# Patient Record
Sex: Male | Born: 1960 | Race: White | Hispanic: No | Marital: Single
Health system: Southern US, Community
[De-identification: ages and names within clinical notes are randomized; demographics above are authoritative.]

## PROBLEM LIST (undated history)

## (undated) DIAGNOSIS — E079 Disorder of thyroid, unspecified: Secondary | ICD-10-CM

## (undated) DIAGNOSIS — C801 Malignant (primary) neoplasm, unspecified: Secondary | ICD-10-CM

## (undated) HISTORY — PX: COLECTOMY: SHX59

---

## 2000-04-06 ENCOUNTER — Emergency Department (HOSPITAL_COMMUNITY): Admission: EM | Admit: 2000-04-06 | Discharge: 2000-04-06 | Payer: Self-pay | Admitting: *Deleted

## 2000-11-08 ENCOUNTER — Emergency Department (HOSPITAL_COMMUNITY): Admission: EM | Admit: 2000-11-08 | Discharge: 2000-11-08 | Payer: Self-pay | Admitting: *Deleted

## 2001-05-15 ENCOUNTER — Emergency Department (HOSPITAL_COMMUNITY): Admission: EM | Admit: 2001-05-15 | Discharge: 2001-05-15 | Payer: Self-pay

## 2001-05-17 ENCOUNTER — Emergency Department (HOSPITAL_COMMUNITY): Admission: EM | Admit: 2001-05-17 | Discharge: 2001-05-17 | Payer: Self-pay | Admitting: Emergency Medicine

## 2001-09-28 ENCOUNTER — Encounter: Payer: Self-pay | Admitting: Emergency Medicine

## 2001-09-28 ENCOUNTER — Emergency Department (HOSPITAL_COMMUNITY): Admission: EM | Admit: 2001-09-28 | Discharge: 2001-09-28 | Payer: Self-pay | Admitting: Emergency Medicine

## 2005-05-30 ENCOUNTER — Emergency Department (HOSPITAL_COMMUNITY): Admission: EM | Admit: 2005-05-30 | Discharge: 2005-05-30 | Payer: Self-pay | Admitting: Emergency Medicine

## 2009-06-01 ENCOUNTER — Ambulatory Visit: Payer: Self-pay | Admitting: Family Medicine

## 2009-06-01 IMAGING — CT CT ABD-PELV W/ CM
2 of 3 series · 15 of 42 positions shown, 20 images · non-contrast
Comparison: none

REASON FOR EXAM: rlq pain x 3 days
COMMENTS:

[Series 2: appendicitis · axial · 0.64mm/px · z∈[-506,-146]mm · 12 of 139 slices shown, 16 images]
[im 13/139  soft-tissue]
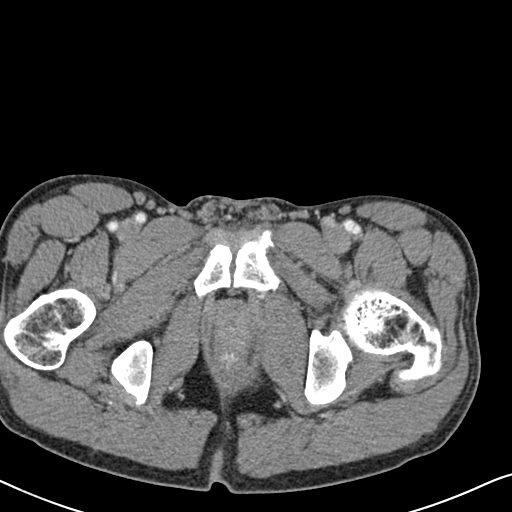
[im 13/139  bone]
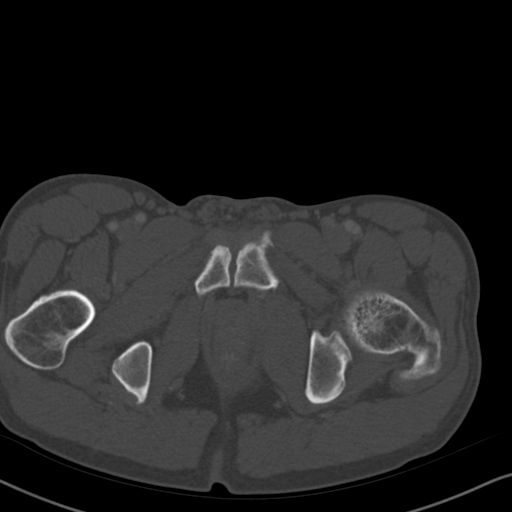
[im 25/139  soft-tissue]
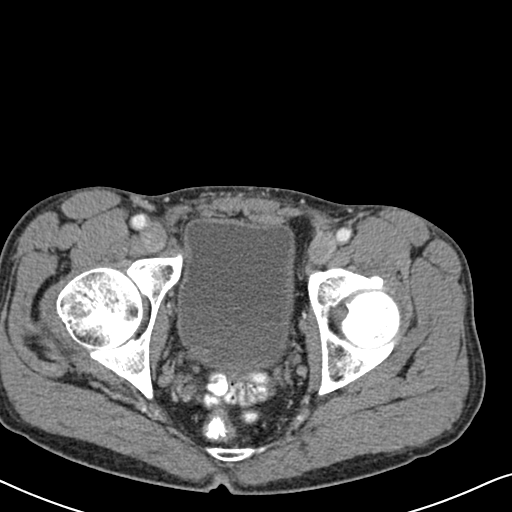
[im 37/139  soft-tissue]
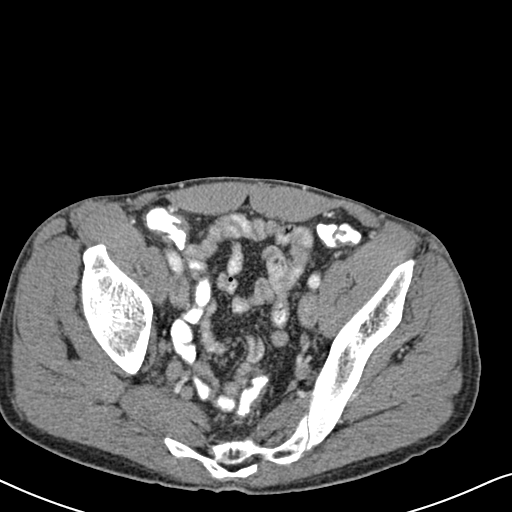
[im 49/139  soft-tissue]
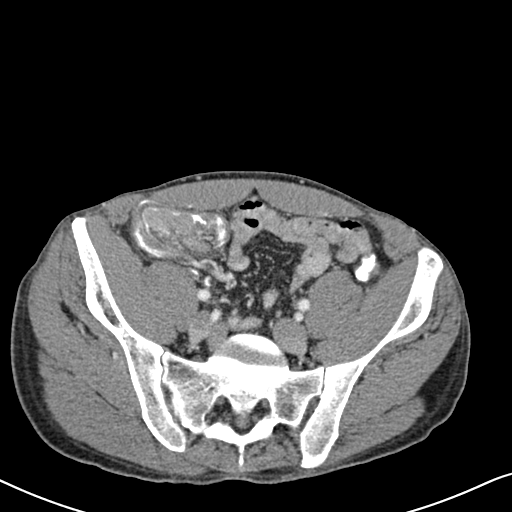
[im 61/139  soft-tissue]
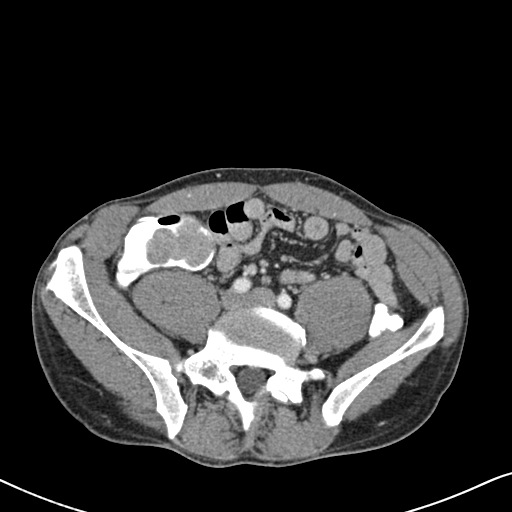
[im 79/139  soft-tissue]
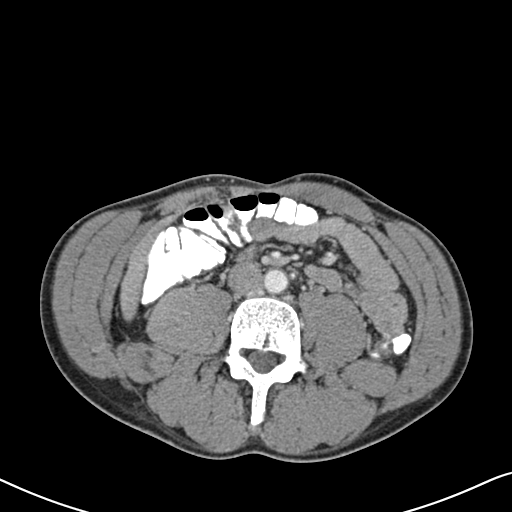
[im 91/139  soft-tissue]
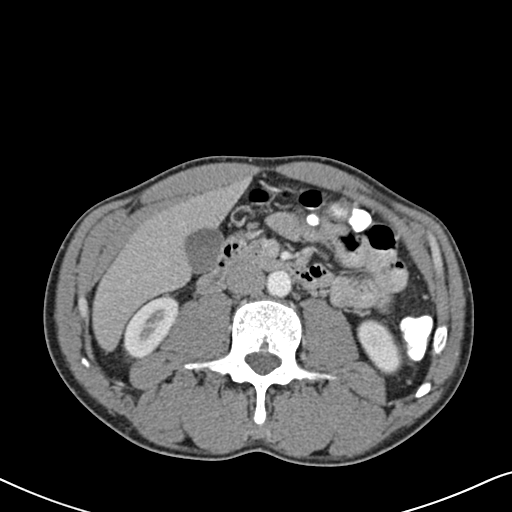
[im 103/139  soft-tissue]
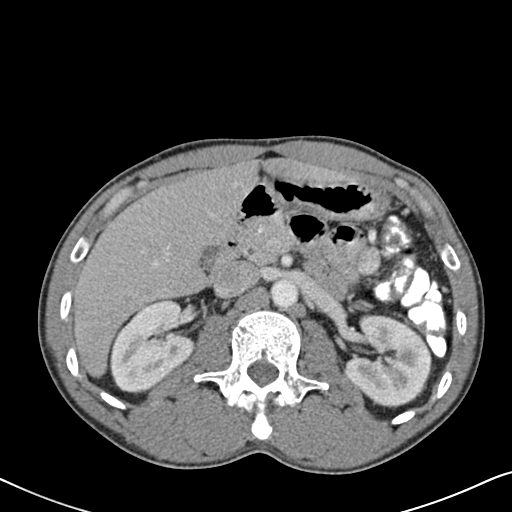
[im 115/139  soft-tissue]
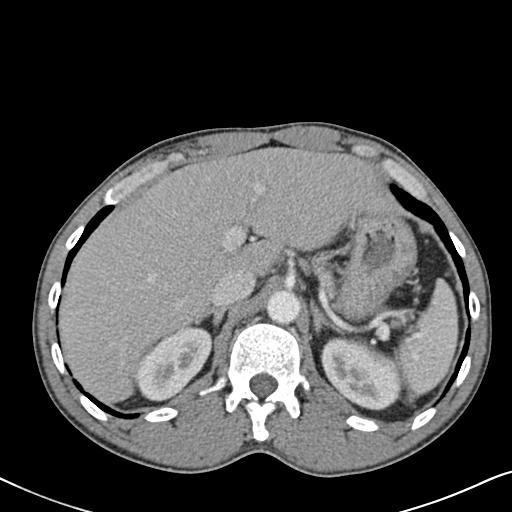
[im 115/139  lung]
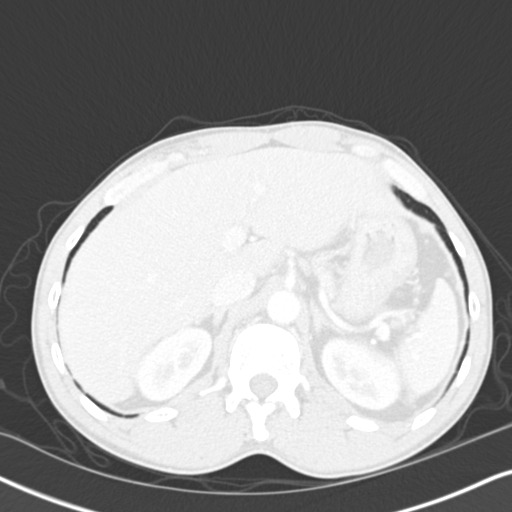
[im 115/139  bone]
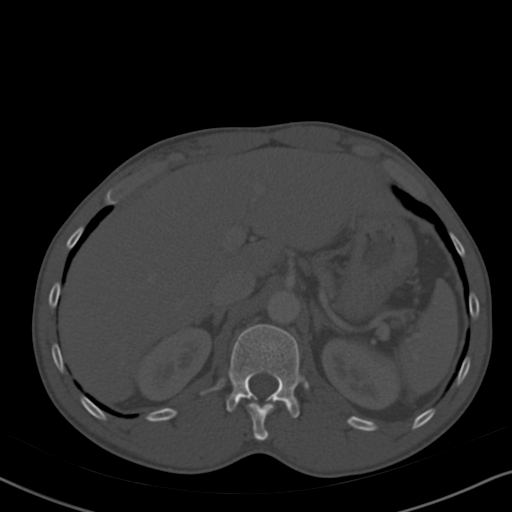
[im 121/139  lung]
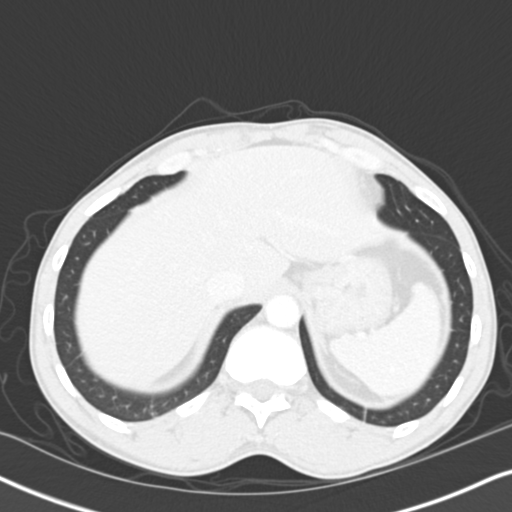
[im 127/139  soft-tissue]
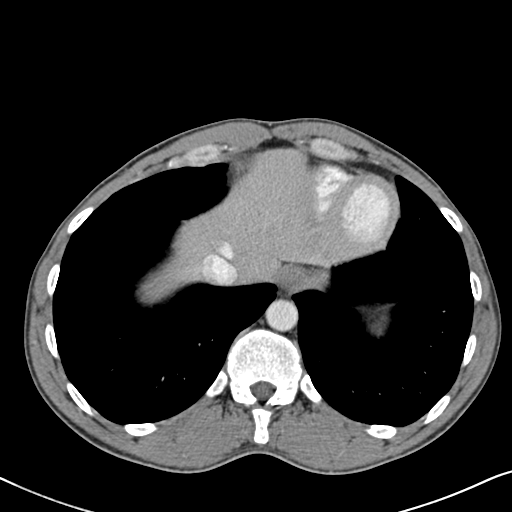
[im 127/139  lung]
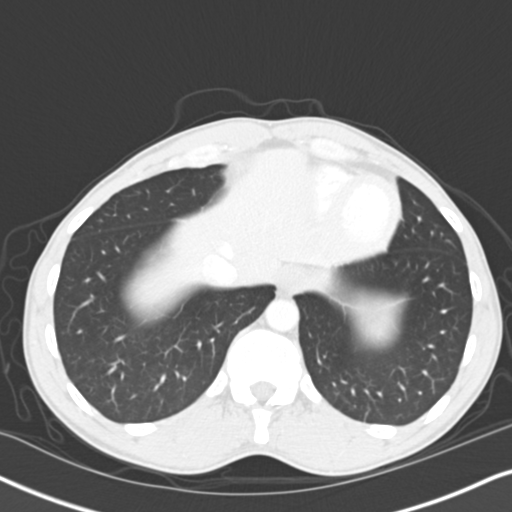
[im 133/139  lung]
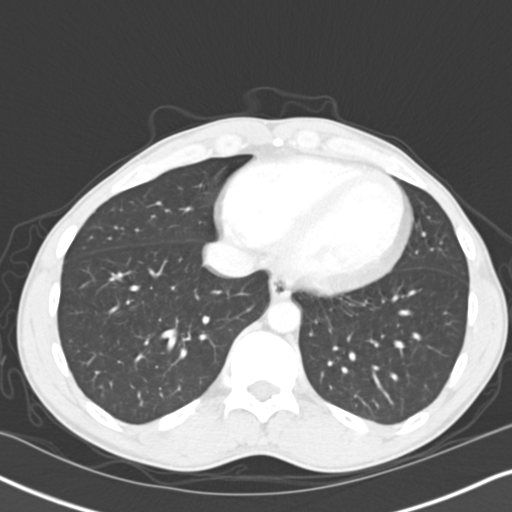

[Series 602: coronals · coronal · 0.83mm/px · 3 of 73 slices shown, 4 images]
[im 25/73  soft-tissue]
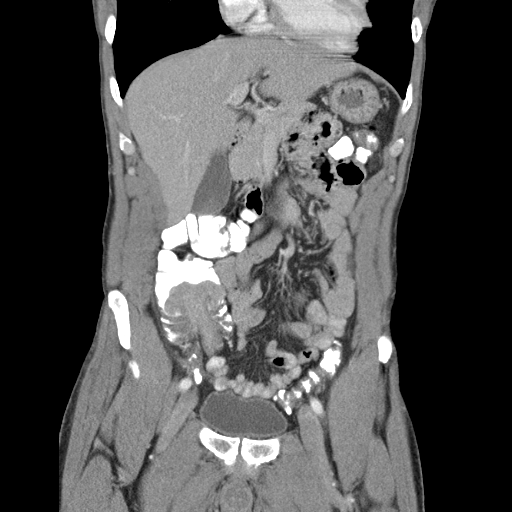
[im 33/73  soft-tissue]
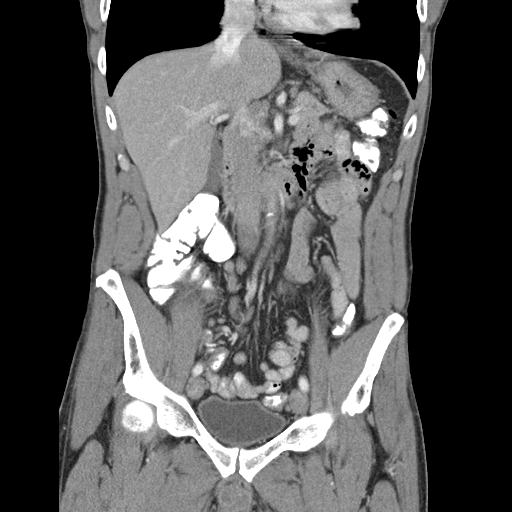
[im 33/73  bone]
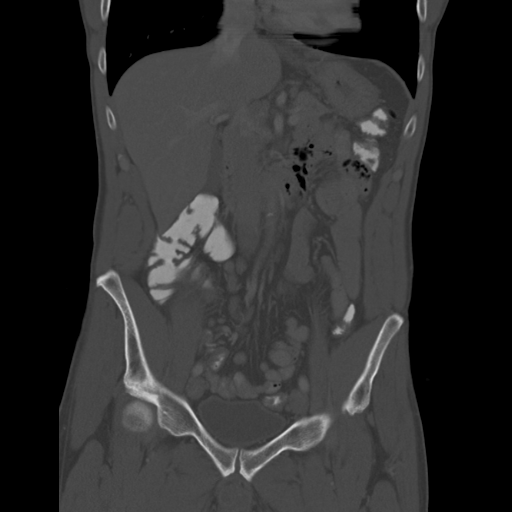
[im 41/73  soft-tissue]
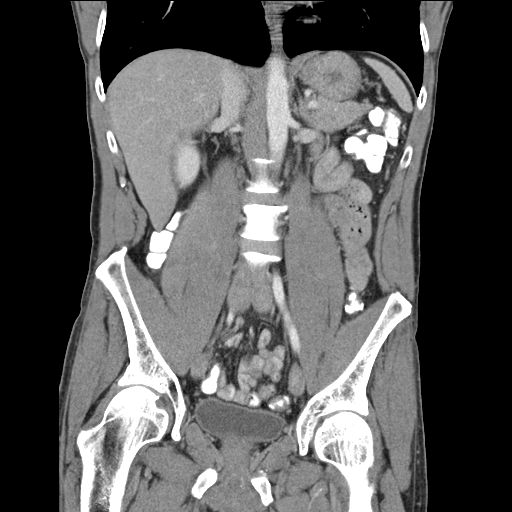

[15 of 42 positions shown; findings below may reference images not displayed]

PROCEDURE:     CT  - CT ABDOMEN / PELVIS  W  - [DATE]  [DATE]

RESULT:     History: Right lower quadrant pain.

Procedure and findings: IV contrast enhanced CT the abdomen/ pelvis obtained
following administration 100 cc of [3X]. Liver normal. Spleen normal.
Pancreas normal. Adrenals normal. Kidneys are normal. No bowel distention.
There is a soft tissue mass within the cecum and soft tissue swelling of the
terminal ileum. Malignancy could present this fashion. Colitis could present
this fashion. No bowel obstruction. No free air. No other focal abnormality
identified.
IMPRESSION: Cecal mass with involvement of the terminal ileum worrisome
for malignancy. Colonoscopy suggested for further evaluation. Report phoned
to the patient's caregiver.

## 2010-10-18 ENCOUNTER — Emergency Department: Payer: Self-pay | Admitting: Emergency Medicine

## 2012-06-13 ENCOUNTER — Emergency Department: Payer: Self-pay | Admitting: Emergency Medicine

## 2013-02-25 ENCOUNTER — Emergency Department: Payer: Self-pay | Admitting: Emergency Medicine

## 2013-02-25 LAB — COMPREHENSIVE METABOLIC PANEL
Albumin: 3.7 g/dL (ref 3.4–5.0)
Alkaline Phosphatase: 139 U/L — ABNORMAL HIGH (ref 50–136)
Anion Gap: 6 — ABNORMAL LOW (ref 7–16)
Calcium, Total: 9.9 mg/dL (ref 8.5–10.1)
Chloride: 105 mmol/L (ref 98–107)
Creatinine: 0.89 mg/dL (ref 0.60–1.30)
EGFR (African American): >60
EGFR (Non-African Amer.): >60
Osmolality: 275 (ref 275–301)
Potassium: 3.9 mmol/L (ref 3.5–5.1)
SGPT (ALT): 73 U/L (ref 12–78)
Sodium: 138 mmol/L (ref 136–145)

## 2013-02-25 LAB — CBC
HCT: 47.1 % (ref 40.0–52.0)
HGB: 16.3 g/dL (ref 13.0–18.0)
MCH: 29.6 pg (ref 26.0–34.0)
MCHC: 34.5 g/dL (ref 32.0–36.0)
MCV: 86 fL (ref 80–100)
Platelet: 151 10*3/uL (ref 150–440)
RBC: 5.5 10*6/uL (ref 4.40–5.90)
RDW: 13.9 % (ref 11.5–14.5)
WBC: 5.6 10*3/uL (ref 3.8–10.6)

## 2015-07-29 ENCOUNTER — Ambulatory Visit
Admission: EM | Admit: 2015-07-29 | Discharge: 2015-07-29 | Disposition: A | Discharge status: Home / Self Care | Payer: BLUE CROSS/BLUE SHIELD | Attending: Family Medicine | Admitting: Family Medicine

## 2015-07-29 DIAGNOSIS — I8001 Phlebitis and thrombophlebitis of superficial vessels of right lower extremity: Secondary | ICD-10-CM | POA: Diagnosis not present

## 2015-07-29 HISTORY — DX: Disorder of thyroid, unspecified: E07.9

## 2015-07-29 HISTORY — DX: Malignant (primary) neoplasm, unspecified: C80.1

## 2015-07-29 NOTE — ED Notes (Signed)
Has had 'bumps' right posterior lower leg "for months". Now getting sore when touched and states right ankle area "darker" in color, but not painful

## 2015-07-29 NOTE — ED Provider Notes (Signed)
CSN: [id number]     Arrival date & time 07/29/15  1311 History   First MD Initiated Contact with Patient 07/29/15 1511     Chief Complaint  Patient presents with  . Rash   (Consider location/radiation/quality/duration/timing/severity/associated sxs/prior Treatment) HPI Comments: 54 yo male with a 2-3 weeks h/o right lower leg skin "bumps", pain and skin color changes ("darker spots"). Denies any trauma, injuries, sores, ulcers, fevers, chills, drainage, prolonged immobilization, insect bites. States is on his feet a lot, standing most of the day. Denies chest pains or shortness of breath.  Patient is a 54 y.o. male presenting with rash. The history is provided by the patient.  Rash   Past Medical History  Diagnosis Date  . Thyroid disease   . Cancer [facility])    Past Surgical History  Procedure Laterality Date  . Colectomy     History reviewed. No pertinent family history. Social History  Substance Use Topics  . Smoking status: Current Every Day Smoker -- 1.00 packs/day    Types: Cigarettes  . Smokeless tobacco: None  . Alcohol Use: Yes     Comment: socially    Review of Systems  Skin: Positive for rash.    Allergies  Review of patient's allergies indicates no known allergies.  Home Medications   Prior to Admission medications   Not on File   Meds Ordered and Administered this Visit  Medications - No data to display  BP 139/75 mmHg  Pulse 62  Temp(Src) 97.1 F (36.2 C) (Tympanic)  Resp 16  Ht 6' (1.829 m)  Wt 155 lb (70.308 kg)  BMI 21.02 kg/m2  SpO2 99% No data found.   Physical Exam  Constitutional: He appears well-developed and well-nourished. No distress.  Cardiovascular: Intact distal pulses.   Skin: He is not diaphoretic.  Right posterior lower leg with multiple medium-large superficial skin varicosities with mild tenderness to palpation and "ropey" consistency to touch  Nursing note and vitals reviewed.   ED Course  Procedures (including  critical care time)  Labs Review Labs Reviewed - No data to display  Imaging Review No results found.   Visual Acuity Review  Right Eye Distance:   Left Eye Distance:   Bilateral Distance:    Right Eye Near:   Left Eye Near:    Bilateral Near:         MDM   1. Thrombophlebitis leg superficial, right     There are no discharge medications for this patient.  1. diagnosis reviewed with patient 2.  Recommend supportive treatment with warm compresses to area and aspirin tid; elevate leg;  3. Follow-up prn if symptoms worsen or don't improve  Norval Gable, MD 07/29/15 [phone]

## 2015-07-29 NOTE — Discharge Instructions (Signed)
Phlebitis  Phlebitis is soreness and swelling (inflammation) of a vein. This can occur in your arms, legs, or torso (trunk), as well as deeper inside your body. Phlebitis is usually not serious when it occurs close to the surface of the body. However, it can cause serious problems when it occurs in a vein deeper inside the body.  CAUSES   Phlebitis can be triggered by various things, including:    Reduced blood flow through your veins. This can happen with:    Bed rest over a long period.    Long-distance travel.    Injury.    Surgery.    Being overweight (obese) or pregnant.   Having an IV tube put in the vein and getting certain medicines through the vein.   Cancer and cancer treatment.   Use of illegal drugs taken through the vein.   Inflammatory diseases.   Inherited (genetic) diseases that increase the risk of blood clots.   Hormone therapy, such as birth control pills.  SIGNS AND SYMPTOMS    Red, tender, swollen, and painful area on your skin. Usually, the area will be long and narrow.   Firmness along the center of the affected area. This can indicate that a blood clot has formed.   Low-grade fever.  DIAGNOSIS   A health care provider can usually diagnose phlebitis by examining the affected area and asking about your symptoms. To check for infection or blood clots, your health care provider may order blood tests or an ultrasound exam of the area. Blood tests and your family history may also indicate if you have an underlying genetic disease that causes blood clots. Occasionally, a piece of tissue is taken from the body (biopsy sample) if an unusual cause of phlebitis is suspected.  TREATMENT   Treatment will vary depending on the severity of the condition and the area of the body affected. Treatment may include:   Use of a warm compress or heating pad.   Use of compression stockings or bandages.   Anti-inflammatory medicines.   Removal of any IV tube that may be causing the problem.   Medicines  that kill germs (antibiotics) if an infection is present.   Blood-thinning medicines if a blood clot is suspected or present.   In rare cases, surgery may be needed to remove damaged sections of vein.  HOME CARE INSTRUCTIONS    Only take over-the-counter or prescription medicines as directed by your health care provider. Take all medicines exactly as prescribed.   Raise (elevate) the affected area above the level of your heart as directed by your health care provider.   Apply a warm compress or heating pad to the affected area as directed by your health care provider. Do not sleep with the heating pad.   Use compression stockings or bandages as directed. These will speed healing and prevent the condition from coming back.   If you are on blood thinners:    Get follow-up blood tests as directed by your health care provider.    Check with your health care provider before using any new medicines.    Carry a medical alert card or wear your medical alert jewelry to show that you are on blood thinners.   For phlebitis in the legs:    Avoid prolonged standing or bed rest.    Keep your legs moving. Raise your legs when sitting or lying.   Do not smoke.   Women, particularly those over the age of 35, should consider   the risks and benefits of taking the contraceptive pill. This kind of hormone treatment can increase your risk for blood clots.   Follow up with your health care provider as directed.  SEEK MEDICAL CARE IF:    You have unusual bruising or any bleeding problems.   Your swelling or pain in the affected area is not improving.   You are on anti-inflammatory medicine, and you develop belly (abdominal) pain.  SEEK IMMEDIATE MEDICAL CARE IF:    You have a sudden onset of chest pain or difficulty breathing.   You have a fever or persistent symptoms for more than 2-3 days.   You have a fever and your symptoms suddenly get worse.  MAKE SURE YOU:   Understand these instructions.   Will watch your  condition.   Will get help right away if you are not doing well or get worse.     This information is not intended to replace advice given to you by your health care provider. Make sure you discuss any questions you have with your health care provider.     Document Released: 08/11/2001 Document Revised: 06/07/2013 Document Reviewed: 04/24/2013  Elsevier Interactive Patient Education 2016 [employer]

## 2016-12-21 ENCOUNTER — Other Ambulatory Visit: Payer: Self-pay | Admitting: Internal Medicine

## 2016-12-22 ENCOUNTER — Other Ambulatory Visit: Payer: Self-pay | Admitting: Internal Medicine

## 2016-12-22 DIAGNOSIS — E0500 Thyrotoxicosis with diffuse goiter without thyrotoxic crisis or storm: Secondary | ICD-10-CM

## 2017-01-21 ENCOUNTER — Encounter
Admission: RE | Admit: 2017-01-21 | Discharge: 2017-01-21 | Disposition: A | Discharge status: Home / Self Care | Payer: BLUE CROSS/BLUE SHIELD | Source: Ambulatory Visit | Attending: Internal Medicine | Admitting: Internal Medicine

## 2017-01-21 ENCOUNTER — Ambulatory Visit
Admission: RE | Admit: 2017-01-21 | Discharge: 2017-01-21 | Disposition: A | Discharge status: Home / Self Care | Payer: BLUE CROSS/BLUE SHIELD | Source: Ambulatory Visit | Attending: Internal Medicine | Admitting: Internal Medicine

## 2017-01-21 DIAGNOSIS — E0500 Thyrotoxicosis with diffuse goiter without thyrotoxic crisis or storm: Secondary | ICD-10-CM | POA: Insufficient documentation

## 2017-01-21 MED ORDER — SODIUM IODIDE I-123 7.4 MBQ CAPS
155.1000 | ORAL_CAPSULE | Freq: Once | ORAL | Status: AC
  Administered 2017-01-21: 155.1 via ORAL

## 2017-01-22 ENCOUNTER — Encounter
Admission: RE | Admit: 2017-01-22 | Discharge: 2017-01-22 | Disposition: A | Discharge status: Home / Self Care | Payer: BLUE CROSS/BLUE SHIELD | Source: Ambulatory Visit | Attending: Internal Medicine | Admitting: Internal Medicine

## 2017-01-26 ENCOUNTER — Ambulatory Visit: Payer: BLUE CROSS/BLUE SHIELD

## 2017-01-28 ENCOUNTER — Encounter
Admission: RE | Admit: 2017-01-28 | Discharge: 2017-01-28 | Disposition: A | Discharge status: Home / Self Care | Payer: BLUE CROSS/BLUE SHIELD | Source: Ambulatory Visit | Attending: Internal Medicine | Admitting: Internal Medicine

## 2017-01-28 DIAGNOSIS — E0500 Thyrotoxicosis with diffuse goiter without thyrotoxic crisis or storm: Secondary | ICD-10-CM | POA: Insufficient documentation

## 2017-01-28 MED ORDER — SODIUM IODIDE I 131 CAPSULE
15.1000 | Freq: Once | INTRAVENOUS | Status: AC | PRN
  Administered 2017-01-28: 15.1 via ORAL

## 2017-04-05 ENCOUNTER — Encounter (INDEPENDENT_AMBULATORY_CARE_PROVIDER_SITE_OTHER): Payer: BLUE CROSS/BLUE SHIELD | Admitting: Vascular Surgery

## 2020-09-06 ENCOUNTER — Other Ambulatory Visit: Payer: Self-pay

## 2020-09-06 DIAGNOSIS — Z20822 Contact with and (suspected) exposure to covid-19: Secondary | ICD-10-CM

## 2020-09-10 LAB — NOVEL CORONAVIRUS, NAA: SARS-CoV-2, NAA: DETECTED — AB

## 2021-04-13 ENCOUNTER — Other Ambulatory Visit: Payer: Self-pay

## 2021-04-13 ENCOUNTER — Encounter (HOSPITAL_COMMUNITY): Payer: Self-pay | Admitting: Emergency Medicine

## 2021-04-13 ENCOUNTER — Ambulatory Visit (HOSPITAL_COMMUNITY)
Admission: EM | Admit: 2021-04-13 | Discharge: 2021-04-13 | Disposition: A | Discharge status: Home / Self Care | Payer: PRIVATE HEALTH INSURANCE | Attending: Student | Admitting: Student

## 2021-04-13 DIAGNOSIS — E0500 Thyrotoxicosis with diffuse goiter without thyrotoxic crisis or storm: Secondary | ICD-10-CM | POA: Diagnosis present

## 2021-04-13 DIAGNOSIS — I83811 Varicose veins of right lower extremities with pain: Secondary | ICD-10-CM | POA: Insufficient documentation

## 2021-04-13 DIAGNOSIS — Z0189 Encounter for other specified special examinations: Secondary | ICD-10-CM | POA: Diagnosis present

## 2021-04-13 LAB — TSH: TSH: <0.01 u[IU]/mL — ABNORMAL LOW (ref 0.350–4.500)

## 2021-04-13 NOTE — ED Provider Notes (Signed)
[place]    CSN: [id number] Arrival date & time: 04/13/21  1117      History   Chief Complaint Chief Complaint  Patient presents with   Ankle Pain    HPI Alexander Raymond is a 60 y.o. male presenting with multiple complaints.  This patient is new to the area and has not received medical care in about 8 months.  Medical history hyperthyroid, colon cancer/colectomy. -Endorses 5-year history of right lower leg and ankle swelling and intermittent pain.  Denies changes in this, new trauma or overuse.  Does state that he stands all day at work, he wears compression socks due to this.  Standing all day does seem to make the pain worse. -Has not been on thyroid medication in 7 or 8 months, endorses jitteriness, weight loss.  He does have an endocrinologist, but has not followed with them in about 2 years due to losing insurance.  He does have insurance again now.  Requesting help establishing care [place].  Denies chest pain, shortness of breath, sensation of heart racing. -He also notes about 8 months of hoarse voice, and states that it feels like food is taking longer to pass through his throat.  Denies trouble swallowing, shortness of breath.  States that his endocrinologist actually did an ultrasound of the thyroid that was normal 2 years ago.  HPI  Past Medical History:  Diagnosis Date   Cancer [facility])    Thyroid disease     There are no problems to display for this patient.   Past Surgical History:  Procedure Laterality Date   COLECTOMY         Home Medications    Prior to Admission medications   Medication Sig Start Date End Date Taking? Authorizing Provider  methimazole (TAPAZOLE) 10 MG tablet Take 1 tablet by mouth daily. Patient not taking: Reported on 04/13/2021 04/05/19   [provider]    Family History Family History  Problem Relation Age of Onset   Hypertension Mother    Healthy Father     Social History Social History   Tobacco  Use   Smoking status: Every Day    Packs/day: 1.00    Types: Cigarettes   Smokeless tobacco: Never  Vaping Use   Vaping Use: Never used  Substance Use Topics   Alcohol use: Yes    Comment: socially   Drug use: Never     Allergies   Patient has no known allergies.   Review of Systems Review of Systems  HENT:  Positive for voice change.   Musculoskeletal:        R ankle pain  All other systems reviewed and are negative.   Physical Exam Triage Vital Signs ED Triage Vitals  Enc Vitals Group     BP 04/13/21 1235 140/86     Pulse Rate 04/13/21 1235 (!) 54     Resp 04/13/21 1235 18     Temp 04/13/21 1235 98.4 F (36.9 C)     Temp Source 04/13/21 1235 Oral     SpO2 04/13/21 1235 100 %     Weight --      Height --      Head Circumference --      Peak Flow --      Pain Score 04/13/21 1232 5     Pain Loc --      Pain Edu? --      Excl. in [place]? --    No data found.  Updated Vital  Signs BP 140/86 (BP Location: Left Arm)   Pulse (!) 54   Temp 98.4 F (36.9 C) (Oral)   Resp 18   SpO2 100%   Visual Acuity Right Eye Distance:   Left Eye Distance:   Bilateral Distance:    Right Eye Near:   Left Eye Near:    Bilateral Near:     Physical Exam Vitals reviewed.  Constitutional:      General: He is not in acute distress.    Appearance: Normal appearance. He is not ill-appearing or diaphoretic.  HENT:     Head: Normocephalic and atraumatic.  Neck:     Thyroid: No thyroid mass, thyromegaly or thyroid tenderness.     Comments: Hoarse voice  Cardiovascular:     Rate and Rhythm: Normal rate and regular rhythm.     Heart sounds: Normal heart sounds.  Pulmonary:     Effort: Pulmonary effort is normal.     Breath sounds: Normal breath sounds.  Musculoskeletal:     Cervical back: Full passive range of motion without pain.     Comments: R ankle - varicose veins. No tenderness of medial or lateral malleolus. No effusion. ROM plantar and dorsiflexion intact and without  pain. DP 2+, cap refil <2 seconds.   Lymphadenopathy:     Cervical: No cervical adenopathy.     Right cervical: No superficial cervical adenopathy.    Left cervical: No superficial cervical adenopathy.  Skin:    General: Skin is warm.  Neurological:     General: No focal deficit present.     Mental Status: He is alert and oriented to person, place, and time.  Psychiatric:        Mood and Affect: Mood normal.        Behavior: Behavior normal.        Thought Content: Thought content normal.        Judgment: Judgment normal.     UC Treatments / Results  Labs (all labs ordered are listed, but only abnormal results are displayed) Labs Reviewed  TSH    EKG   Radiology No results found.  Procedures Procedures (including critical care time)  Medications Ordered in UC Medications - No data to display  Initial Impression / Assessment and Plan / UC Course  I have reviewed the triage vital signs and the nursing notes.  Pertinent labs & imaging results that were available during my care of the patient were reviewed by me and considered in my medical decision making (see chart for details).     This patient is a very pleasant 60 y.o. year old male presenting with hyperthyroid/graves disease, varicose veins.  Has been off of his thyroid medication for about 8 months due to losing his insurance, he now has insurance again and is wishing to restart this.  He is followed by endocrinology, I advised him to call them tomorrow.  We will go ahead and check a TSH today.  For varicose veins, continue compression.  I sent a PCP referral, and establish care with them. ED return precautions discussed. Patient verbalizes understanding and agreement.    Final Clinical Impressions(s) / UC Diagnoses   Final diagnoses:  Graves disease  Routine lab draw  Varicose veins of right lower extremity with pain     Discharge Instructions      -Please call your endocrinologist tomorrow to follow-up  with them, they will need to restart your medication. -I placed a referral for you to establish care with a  PCP, please establish care with them -Compression socks at work and while standing.     ED Prescriptions   None    PDMP not reviewed this encounter.   Hazel Sams, PA-C 04/13/21 1411

## 2021-04-13 NOTE — ED Triage Notes (Signed)
Patient reports a 5 year history of right lower leg/ankle swelling and intermittent pain for 5 (five) years  Patient reports he has not been on thyroid medicine for 7-8 months: reports jitteriness, weight loss.    Patient asking for assistance in establishing care in [place].

## 2021-04-13 NOTE — Discharge Instructions (Addendum)
-  Please call your endocrinologist tomorrow to follow-up with them, they will need to restart your medication. -I placed a referral for you to establish care with a PCP, please establish care with them -Compression socks at work and while standing.

## 2021-04-14 ENCOUNTER — Telehealth (HOSPITAL_COMMUNITY): Payer: Self-pay | Admitting: Emergency Medicine

## 2021-04-14 MED ORDER — METHIMAZOLE 10 MG PO TABS: 10.0000 mg | ORAL_TABLET | Freq: Every day | ORAL | 0 refills | Status: AC

## 2023-06-23 ENCOUNTER — Institutional Professional Consult (permissible substitution) (INDEPENDENT_AMBULATORY_CARE_PROVIDER_SITE_OTHER): Payer: PRIVATE HEALTH INSURANCE | Admitting: Otolaryngology
